# Patient Record
Sex: Male | Born: 1985 | Race: White | Hispanic: No | Marital: Married | Smoking: Current every day smoker
Health system: Southern US, Community
[De-identification: ages and names within clinical notes are randomized; demographics above are authoritative.]

## PROBLEM LIST (undated history)

## (undated) DIAGNOSIS — J45909 Unspecified asthma, uncomplicated: Secondary | ICD-10-CM

---

## 2010-06-06 ENCOUNTER — Emergency Department: Payer: Self-pay | Admitting: Emergency Medicine

## 2010-09-11 ENCOUNTER — Emergency Department: Payer: Self-pay | Admitting: Emergency Medicine

## 2011-11-12 ENCOUNTER — Emergency Department: Payer: Self-pay | Admitting: Emergency Medicine

## 2015-10-31 ENCOUNTER — Encounter: Payer: Self-pay | Admitting: Medical Oncology

## 2015-10-31 ENCOUNTER — Emergency Department: Payer: BLUE CROSS/BLUE SHIELD

## 2015-10-31 ENCOUNTER — Emergency Department
Admission: EM | Admit: 2015-10-31 | Discharge: 2015-10-31 | Disposition: A | Discharge status: Home / Self Care | Payer: BLUE CROSS/BLUE SHIELD | Attending: Emergency Medicine | Admitting: Emergency Medicine

## 2015-10-31 DIAGNOSIS — J069 Acute upper respiratory infection, unspecified: Secondary | ICD-10-CM | POA: Diagnosis not present

## 2015-10-31 DIAGNOSIS — J45909 Unspecified asthma, uncomplicated: Secondary | ICD-10-CM | POA: Insufficient documentation

## 2015-10-31 DIAGNOSIS — J209 Acute bronchitis, unspecified: Secondary | ICD-10-CM | POA: Diagnosis not present

## 2015-10-31 DIAGNOSIS — J029 Acute pharyngitis, unspecified: Secondary | ICD-10-CM | POA: Diagnosis present

## 2015-10-31 HISTORY — DX: Unspecified asthma, uncomplicated: J45.909

## 2015-10-31 MED ORDER — CHLORPHENIRAMINE MALEATE 4 MG PO TABS: 4.0000 mg | ORAL_TABLET | Freq: Two times a day (BID) | ORAL | 0 refills | Status: AC | PRN

## 2015-10-31 MED ORDER — AZITHROMYCIN 250 MG PO TABS: ORAL_TABLET | ORAL | 0 refills | Status: AC

## 2015-10-31 MED ORDER — GUAIFENESIN-CODEINE 100-10 MG/5ML PO SOLN: 10.0000 mL | ORAL | 0 refills | Status: AC | PRN

## 2015-10-31 NOTE — ED Provider Notes (Signed)
[facility] Emergency Department Provider Note  ____________________________________________  Time seen: Approximately 10:38 AM  I have reviewed the triage vital signs and the nursing notes.   HISTORY  Chief Complaint Cough; Sore Throat; and URI    HPI Carl Park is a 30 y.o. male presents for evaluation of cough congestion drainage 10 days. Patient states no relief with over-the-counter medication. Denies any fever chills nausea or vomiting.   Past Medical History:  Diagnosis Date  . Asthma     There are no active problems to display for this patient.   No past surgical history on file.  Prior to Admission medications   Medication Sig Start Date End Date Taking? Authorizing Provider  azithromycin (ZITHROMAX Z-PAK) 250 MG tablet Take 2 tablets (500 mg) on  Day 1,  followed by 1 tablet (250 mg) once daily on Days 2 through 5. 10/31/15   Evangeline Dakin, PA-C  chlorpheniramine (CHLOR-TRIMETON) 4 MG tablet Take 1 tablet (4 mg total) by mouth 2 (two) times daily as needed for allergies or rhinitis. 10/31/15   Charmayne Sheer Abass Misener, PA-C  guaiFENesin-codeine 100-10 MG/5ML syrup Take 10 mLs by mouth every 4 (four) hours as needed for cough. 10/31/15   Evangeline Dakin, PA-C    Allergies Review of patient's allergies indicates no known allergies.  No family history on file.  Social History Social History  Substance Use Topics  . Smoking status: Not on file  . Smokeless tobacco: Not on file  . Alcohol use Not on file    Review of Systems Constitutional: No fever/chills Eyes: No visual changes. ENT: No sore throat.As of nasal congestion and drainage. Cardiovascular: Denies chest pain. Respiratory: Denies shortness of breath.Positive for cough. Gastrointestinal: No abdominal pain.  No nausea, no vomiting.  No diarrhea.  No constipation. Genitourinary: Negative for dysuria. Musculoskeletal: Negative for back pain. Skin: Negative for rash. Neurological:  Negative for headaches, focal weakness or numbness.  10-point ROS otherwise negative.  ____________________________________________   PHYSICAL EXAM:  VITAL SIGNS: ED Triage Vitals  Enc Vitals Group     BP 10/31/15 1021 114/81     Pulse Rate 10/31/15 1021 84     Resp 10/31/15 1021 17     Temp 10/31/15 1021 98 F (36.7 C)     Temp Source 10/31/15 1021 Oral     SpO2 10/31/15 1021 97 %     Weight 10/31/15 1020 220 lb (99.8 kg)     Height 10/31/15 1020 5\' 11"  (1.803 m)     Head Circumference --      Peak Flow --      Pain Score --      Pain Loc --      Pain Edu? --      Excl. in GC? --     Constitutional: Alert and oriented. Well appearing and in no acute distress. Eyes: Conjunctivae are normal. PERRL. EOMI. Head: Atraumatic. Nose: Positive congestion/rhinnorhea. Mouth/Throat: Mucous membranes are moist.  Oropharynx non-erythematous. Neck: No stridor.   Cardiovascular: Normal rate, regular rhythm. Grossly normal heart sounds.  Good peripheral circulation. Respiratory: Normal respiratory effort.  No retractions. Lungs scattered coarse breath sounds bilaterally. Musculoskeletal: No lower extremity tenderness nor edema.  No joint effusions. Neurologic:  Normal speech and language. No gross focal neurologic deficits are appreciated. No gait instability. Skin:  Skin is warm, dry and intact. No rash noted. Psychiatric: Mood and affect are normal. Speech and behavior are normal.  ____________________________________________   LABS (all labs ordered  are listed, but only abnormal results are displayed)  Labs Reviewed - No data to display ____________________________________________  EKG   ____________________________________________  RADIOLOGY  Acute bronchitic changes and no evidence of pneumonia. ____________________________________________   PROCEDURES  Procedure(s) performed: None  Critical Care performed:  No  ____________________________________________   INITIAL IMPRESSION / ASSESSMENT AND PLAN / ED COURSE  Pertinent labs & imaging results that were available during my care of the patient were reviewed by me and considered in my medical decision making (see chart for details). Review of the [place] CSRS was performed in accordance of the [facility] prior to dispensing any controlled drugs.  Acute bronchitis/URI. Rx given for Z-Pak, Robitussin-AC, chlorpheniramine. Patient follow-up with PCP or return to ER with any worsening symptomology.  Clinical Course    ____________________________________________   FINAL CLINICAL IMPRESSION(S) / ED DIAGNOSES  Final diagnoses:  URI (upper respiratory infection)  Acute bronchitis, unspecified organism     This chart was dictated using voice recognition software/Dragon. Despite best efforts to proofread, errors can occur which can change the meaning. Any change was purely unintentional.    Evangeline Dakinharles M Arilla Hice, PA-C 10/31/15 1147    Minna AntisKevin Paduchowski, MD 10/31/15 [phone]

## 2015-10-31 NOTE — ED Notes (Signed)
See triage note  Developed cough and cold sx's about 1 month ago.  States cough has been occasional prod  No resp distress noted on arrival

## 2015-10-31 NOTE — ED Triage Notes (Signed)
Pt reports cough, sore throat, congestion x 1 month that will not go away.

## 2015-12-27 ENCOUNTER — Emergency Department
Admission: EM | Admit: 2015-12-27 | Discharge: 2015-12-27 | Disposition: A | Discharge status: Home / Self Care | Payer: Worker's Compensation | Attending: Emergency Medicine | Admitting: Emergency Medicine

## 2015-12-27 ENCOUNTER — Emergency Department: Payer: Worker's Compensation

## 2015-12-27 ENCOUNTER — Encounter: Payer: Self-pay | Admitting: *Deleted

## 2015-12-27 DIAGNOSIS — M25561 Pain in right knee: Secondary | ICD-10-CM

## 2015-12-27 DIAGNOSIS — M25511 Pain in right shoulder: Secondary | ICD-10-CM

## 2015-12-27 DIAGNOSIS — Y999 Unspecified external cause status: Secondary | ICD-10-CM | POA: Diagnosis not present

## 2015-12-27 DIAGNOSIS — S161XXA Strain of muscle, fascia and tendon at neck level, initial encounter: Secondary | ICD-10-CM | POA: Insufficient documentation

## 2015-12-27 DIAGNOSIS — Y92410 Unspecified street and highway as the place of occurrence of the external cause: Secondary | ICD-10-CM | POA: Diagnosis not present

## 2015-12-27 DIAGNOSIS — J45909 Unspecified asthma, uncomplicated: Secondary | ICD-10-CM | POA: Insufficient documentation

## 2015-12-27 DIAGNOSIS — Y9389 Activity, other specified: Secondary | ICD-10-CM | POA: Insufficient documentation

## 2015-12-27 DIAGNOSIS — S199XXA Unspecified injury of neck, initial encounter: Secondary | ICD-10-CM | POA: Diagnosis present

## 2015-12-27 MED ORDER — IBUPROFEN 800 MG PO TABS: 800.0000 mg | ORAL_TABLET | Freq: Three times a day (TID) | ORAL | 0 refills | Status: AC

## 2015-12-27 MED ORDER — IBUPROFEN 600 MG PO TABS
600.0000 mg | ORAL_TABLET | Freq: Once | ORAL | Status: AC
  Administered 2015-12-27: 600 mg via ORAL
  Filled 2015-12-27: qty 1

## 2015-12-27 NOTE — Discharge Instructions (Signed)
Ice or heat to muscles as needed for inflammation and pain and to help with comfort. Ice to your right knee as needed for pain or swelling. Take ibuprofen 800 mg 3 times a day with food. [facility] if any continued problems.
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
## 2016-05-31 ENCOUNTER — Emergency Department
Admission: EM | Admit: 2016-05-31 | Discharge: 2016-05-31 | Disposition: A | Discharge status: Home / Self Care | Payer: BLUE CROSS/BLUE SHIELD | Attending: Emergency Medicine | Admitting: Emergency Medicine

## 2016-05-31 DIAGNOSIS — K0889 Other specified disorders of teeth and supporting structures: Secondary | ICD-10-CM

## 2016-05-31 DIAGNOSIS — K0381 Cracked tooth: Secondary | ICD-10-CM | POA: Insufficient documentation

## 2016-05-31 DIAGNOSIS — J45909 Unspecified asthma, uncomplicated: Secondary | ICD-10-CM | POA: Insufficient documentation

## 2016-05-31 DIAGNOSIS — F17210 Nicotine dependence, cigarettes, uncomplicated: Secondary | ICD-10-CM | POA: Diagnosis not present

## 2016-05-31 DIAGNOSIS — S025XXA Fracture of tooth (traumatic), initial encounter for closed fracture: Secondary | ICD-10-CM

## 2016-05-31 MED ORDER — TRAMADOL HCL 50 MG PO TABS: 50.0000 mg | ORAL_TABLET | Freq: Four times a day (QID) | ORAL | 0 refills | Status: AC | PRN

## 2016-05-31 MED ORDER — AMOXICILLIN 500 MG PO CAPS: 500.0000 mg | ORAL_CAPSULE | Freq: Three times a day (TID) | ORAL | 0 refills | Status: AC

## 2016-05-31 NOTE — ED Provider Notes (Signed)
[facility] Emergency Department Provider Note   ____________________________________________   First MD Initiated Contact with Patient 05/31/16 [phone]     (approximate)  I have reviewed the triage vital signs and the nursing notes.   HISTORY  Chief Complaint Dental Pain    HPI Carl Park is a 31 y.o. male patient complaining of that the pain for approximately one month.Patient is a fracture upper left molar. Patient rates his pain as a 2/10. Patient states does not have dental insurance. No palliative measures for this complaint.   Past Medical History:  Diagnosis Date  . Asthma     There are no active problems to display for this patient.   History reviewed. No pertinent surgical history.  Prior to Admission medications   Medication Sig Start Date End Date Taking? Authorizing Provider  amoxicillin (AMOXIL) 500 MG capsule Take 1 capsule (500 mg total) by mouth 3 (three) times daily. 05/31/16   Joni Reining, PA-C  azithromycin (ZITHROMAX Z-PAK) 250 MG tablet Take 2 tablets (500 mg) on  Day 1,  followed by 1 tablet (250 mg) once daily on Days 2 through 5. 10/31/15   Evangeline Dakin, PA-C  chlorpheniramine (CHLOR-TRIMETON) 4 MG tablet Take 1 tablet (4 mg total) by mouth 2 (two) times daily as needed for allergies or rhinitis. 10/31/15   Charmayne Sheer Beers, PA-C  guaiFENesin-codeine 100-10 MG/5ML syrup Take 10 mLs by mouth every 4 (four) hours as needed for cough. 10/31/15   Charmayne Sheer Beers, PA-C  ibuprofen (ADVIL,MOTRIN) 800 MG tablet Take 1 tablet (800 mg total) by mouth 3 (three) times daily. 12/27/15   Tommi Rumps, PA-C  traMADol (ULTRAM) 50 MG tablet Take 1 tablet (50 mg total) by mouth every 6 (six) hours as needed for moderate pain. 05/31/16   Joni Reining, PA-C    Allergies Patient has no known allergies.  No family history on file.  Social History Social History  Substance Use Topics  . Smoking status: Current Every Day Smoker   Types: Cigarettes  . Smokeless tobacco: Never Used  . Alcohol use No    Review of Systems Constitutional: No fever/chills Eyes: No visual changes. ENT: No sore throat. Cardiovascular: Denies chest pain. Respiratory: Denies shortness of breath. Gastrointestinal: No abdominal pain.  No nausea, no vomiting.  No diarrhea.  No constipation. Genitourinary: Negative for dysuria. Musculoskeletal: Negative for back pain. Skin: Negative for rash. Neurological: Negative for headaches, focal weakness or numbness.  ____________________________________________   PHYSICAL EXAM:  VITAL SIGNS: ED Triage Vitals  Enc Vitals Group     BP 05/31/16 0755 130/82     Pulse Rate 05/31/16 0755 87     Resp 05/31/16 0755 18     Temp 05/31/16 0755 98 F (36.7 C)     Temp Source 05/31/16 0755 Oral     SpO2 05/31/16 0755 100 %     Weight 05/31/16 0754 225 lb (102.1 kg)     Height 05/31/16 0754  (1.803 m)     Head Circumference --      Peak Flow --      Pain Score 05/31/16 0754 2     Pain Loc --      Pain Edu? --      Excl. in GC? --     Constitutional: Alert and oriented. Well appearing and in no acute distress. Eyes: Conjunctivae are normal. PERRL. EOMI. Head: Atraumatic. Nose: No congestion/rhinnorhea. Mouth/Throat: Mucous membranes are moist.  Oropharynx non-erythematous.Fractured  tooth #4 Neck: No stridor. No cervical spine tenderness to palpation. Hematological/Lymphatic/Immunilogical: No cervical lymphadenopathy. Cardiovascular: Normal rate, regular rhythm. Grossly normal heart sounds.  Good peripheral circulation. Respiratory: Normal respiratory effort.  No retractions. Lungs CTAB. Gastrointestinal: Soft and nontender. No distention. No abdominal bruits. No CVA tenderness. Musculoskeletal: No lower extremity tenderness nor edema.  No joint effusions. Neurologic:  Normal speech and language. No gross focal neurologic deficits are appreciated. No gait instability. Skin:  Skin is  warm, dry and intact. No rash noted. Psychiatric: Mood and affect are normal. Speech and behavior are normal.  ____________________________________________   LABS (all labs ordered are listed, but only abnormal results are displayed)  Labs Reviewed - No data to display ____________________________________________  EKG   ____________________________________________  RADIOLOGY   ____________________________________________   PROCEDURES  Procedure(s) performed: None  Procedures  Critical Care performed: No  ____________________________________________   INITIAL IMPRESSION / ASSESSMENT AND PLAN / ED COURSE  Pertinent labs & imaging results that were available during my care of the patient were reviewed by me and considered in my medical decision making (see chart for details).  Dental pain secondary to fractured tooth.      ____________________________________________   FINAL CLINICAL IMPRESSION(S) / ED DIAGNOSES  Final diagnoses:  Pain, dental  Closed fracture of tooth, initial encounter  Patient given discharge care instructions. Patient advised follow-up with [facility]. Patient given a prescription for amoxicillin tramadol.    NEW MEDICATIONS STARTED DURING THIS VISIT:  Discharge Medication List as of 05/31/2016  8:12 AM    START taking these medications   Details  amoxicillin (AMOXIL) 500 MG capsule Take 1 capsule (500 mg total) by mouth 3 (three) times daily., Starting Thu 05/31/2016, Print    traMADol (ULTRAM) 50 MG tablet Take 1 tablet (50 mg total) by mouth every 6 (six) hours as needed for moderate pain., Starting Thu 05/31/2016, Print         Note:  This document was prepared using Dragon voice recognition software and may include unintentional dictation errors.    Joni Reining, PA-C 05/31/16 9604    Emily Filbert, MD 06/28/16 [phone]

## 2016-05-31 NOTE — ED Triage Notes (Signed)
Pt c/o left upper tooth pain for the past month

## 2016-10-13 DEATH — deceased

## 2017-10-23 IMAGING — CR DG CHEST 2V
1 series · 2 of 2 positions shown · non-contrast
Comparison: None.

CLINICAL DATA: Cough and congestion

EXAM:
CHEST  2 VIEW

[Series 1: dg chest 2 view · 0.14mm/px · 2 of 2 slices shown]
[im 1/2]
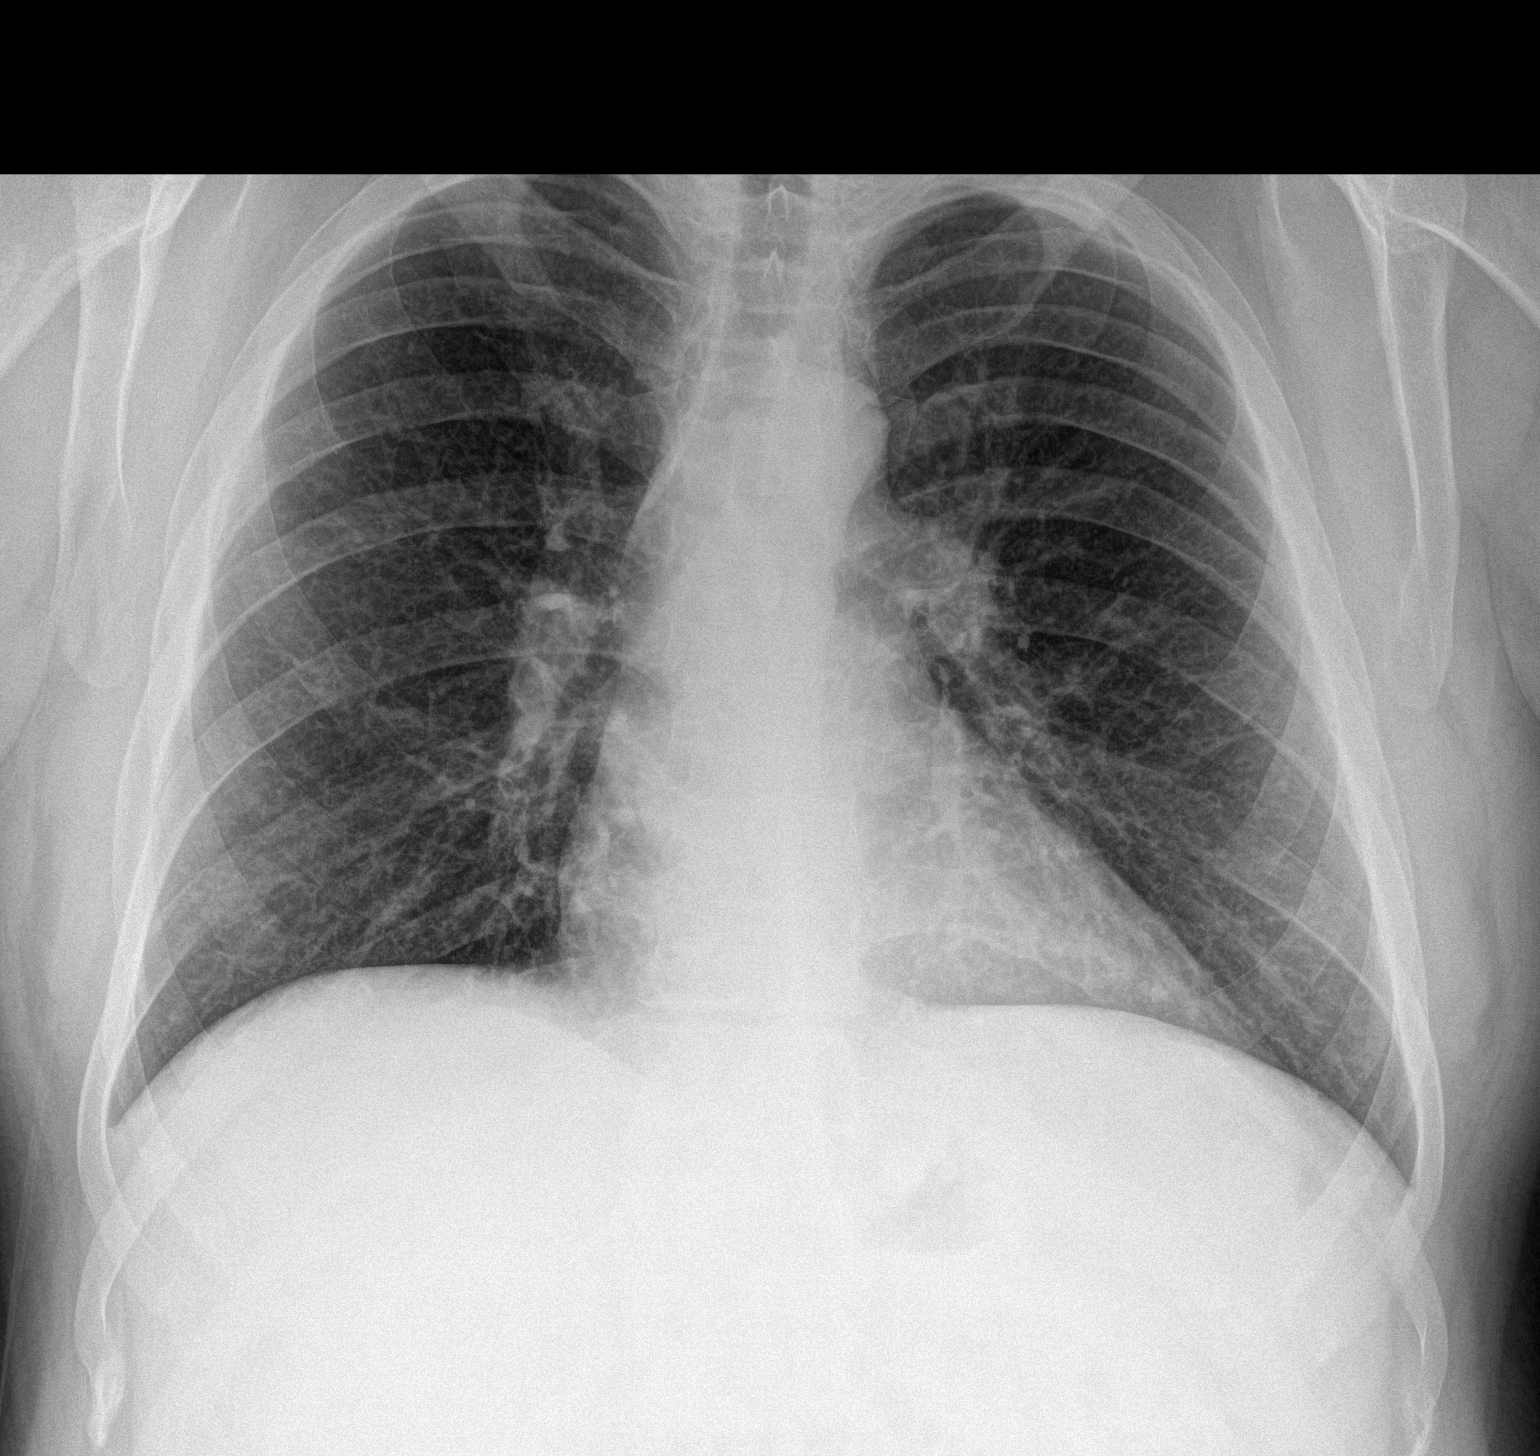
[im 2/2]
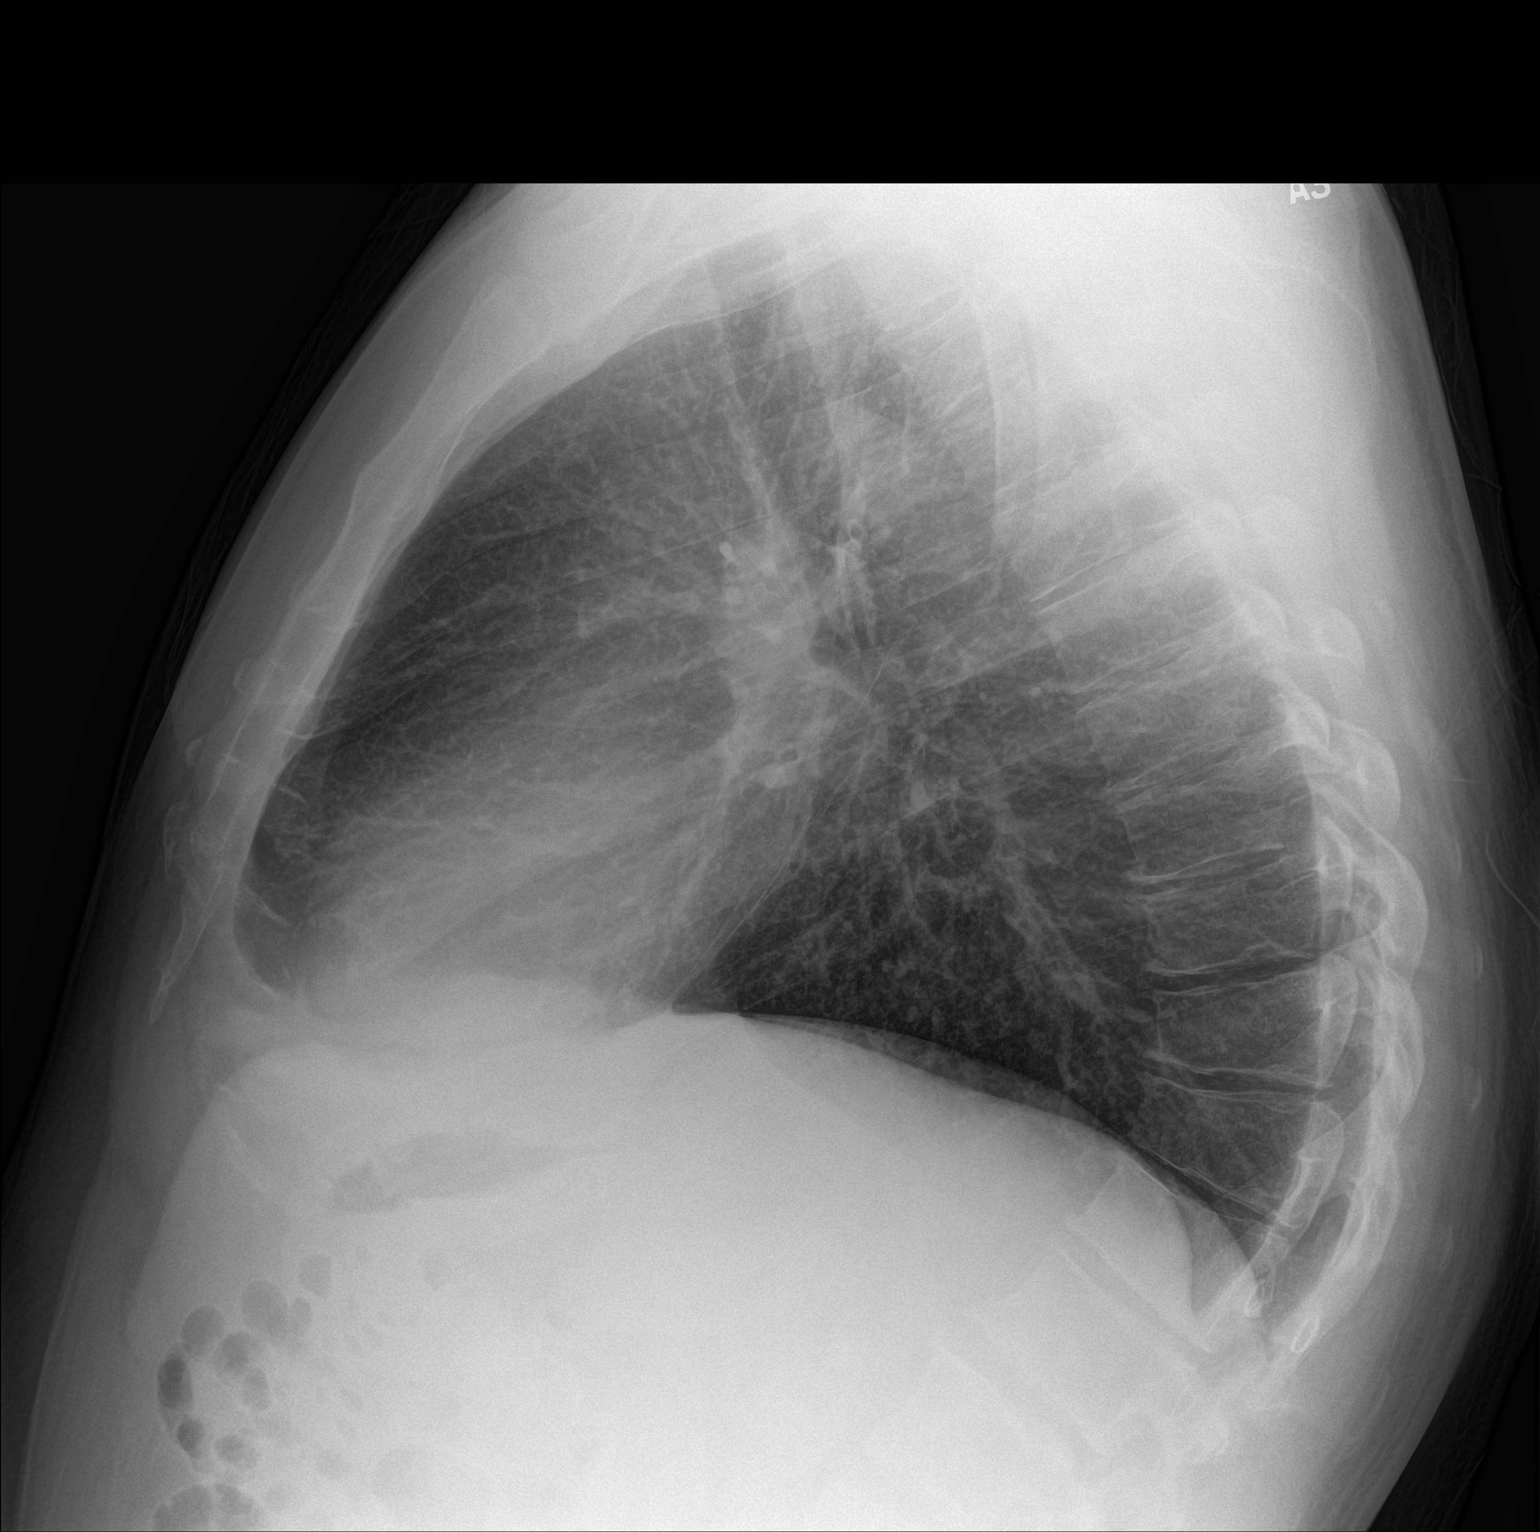

[2 of 2 positions shown; findings below may reference images not displayed]

FINDINGS: Mild bronchitic markings. There is no edema, consolidation,
effusion, or pneumothorax. Exaggerated thoracic kyphosis with
chronic endplate irregularities and lower thoracic wedging. Normal
heart size and mediastinal contour
IMPRESSION: Bronchitic markings without pneumonia.
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
